# Patient Record
Sex: Female | Born: 1965 | Race: White | Hispanic: No | Marital: Married | Smoking: Former smoker
Health system: Southern US, Community
[De-identification: ages and names within clinical notes are randomized; demographics above are authoritative.]

## PROBLEM LIST (undated history)

## (undated) DIAGNOSIS — R112 Nausea with vomiting, unspecified: Secondary | ICD-10-CM

## (undated) DIAGNOSIS — Z9889 Other specified postprocedural states: Secondary | ICD-10-CM

## (undated) DIAGNOSIS — T8859XA Other complications of anesthesia, initial encounter: Secondary | ICD-10-CM

## (undated) DIAGNOSIS — M169 Osteoarthritis of hip, unspecified: Secondary | ICD-10-CM

## (undated) HISTORY — PX: PARTIAL HYSTERECTOMY: SHX80

---

## 2003-06-29 HISTORY — PX: HAND SURGERY: SHX662

## 2006-06-28 HISTORY — PX: CHOLECYSTECTOMY: SHX55

## 2017-09-03 DIAGNOSIS — J22 Unspecified acute lower respiratory infection: Secondary | ICD-10-CM | POA: Insufficient documentation

## 2017-09-12 ENCOUNTER — Other Ambulatory Visit: Payer: Self-pay | Admitting: Family Medicine

## 2017-09-12 DIAGNOSIS — Z1231 Encounter for screening mammogram for malignant neoplasm of breast: Secondary | ICD-10-CM

## 2017-09-29 ENCOUNTER — Ambulatory Visit
Admission: RE | Admit: 2017-09-29 | Discharge: 2017-09-29 | Disposition: A | Discharge status: Home / Self Care | Payer: BLUE CROSS/BLUE SHIELD | Source: Ambulatory Visit | Attending: Family Medicine | Admitting: Family Medicine

## 2017-09-29 DIAGNOSIS — Z1231 Encounter for screening mammogram for malignant neoplasm of breast: Secondary | ICD-10-CM

## 2018-08-08 ENCOUNTER — Ambulatory Visit (INDEPENDENT_AMBULATORY_CARE_PROVIDER_SITE_OTHER): Payer: BLUE CROSS/BLUE SHIELD | Admitting: Orthopaedic Surgery

## 2018-08-08 ENCOUNTER — Ambulatory Visit (INDEPENDENT_AMBULATORY_CARE_PROVIDER_SITE_OTHER): Payer: BLUE CROSS/BLUE SHIELD

## 2018-08-08 ENCOUNTER — Encounter (INDEPENDENT_AMBULATORY_CARE_PROVIDER_SITE_OTHER): Payer: Self-pay | Admitting: Orthopaedic Surgery

## 2018-08-08 VITALS — Ht 65.0 in | Wt 268.0 lb

## 2018-08-08 DIAGNOSIS — M1611 Unilateral primary osteoarthritis, right hip: Secondary | ICD-10-CM

## 2018-08-08 DIAGNOSIS — M545 Low back pain, unspecified: Secondary | ICD-10-CM

## 2018-08-08 DIAGNOSIS — M25572 Pain in left ankle and joints of left foot: Secondary | ICD-10-CM

## 2018-08-08 DIAGNOSIS — G8929 Other chronic pain: Secondary | ICD-10-CM

## 2018-08-08 NOTE — Progress Notes (Addendum)
Office Visit Note   Patient: Kari Salinas           Date of Birth: 18-Nov-1965           MRN: [id number] Visit Date: 08/08/2018              Requested by: Richmond CampbellKaplan, Kristen W., PA-C [address] [place], [place] [id number] PCP: Richmond CampbellKaplan, Kristen W., PA-C   Assessment & Plan: Visit Diagnoses:  1. Chronic pain of left ankle   2. Primary osteoarthritis of right hip   3. Low back pain, unspecified back pain laterality, unspecified chronicity, unspecified whether sciatica present     Plan: Impression is end-stage right hip degenerative joint disease and early mild left ankle osteoarthritis more symptomatic on the lateral side.  We discussed surgical versus nonsurgical treatment options regarding the right hip today.  Patient would like to try cortisone injection in both the left ankle and the right hip first.  We will schedule this with Dr. Prince Romehilts in the near future.  She has my card if she needs to get in touch with us.  Patient understands that she will likely require hip replacement for any meaningful pain relief.  Follow-Up Instructions: Return if symptoms worsen or fail to improve.   Orders:  Orders Placed This Encounter  Procedures  . XR Ankle Complete Left  . XR Foot Complete Left  . XR HIP UNILAT W OR W/O PELVIS 2-3 VIEWS RIGHT  . XR Lumbar Spine 2-3 Views   No orders of the defined types were placed in this encounter.     Procedures: No procedures performed   Clinical Data: No additional findings.   Subjective: Chief Complaint  Patient presents with  . Right Hip - Pain  . Left Foot - Pain  . Left Ankle - Pain    Kari Salinas is a very pleasant 53 year old female who comes in with 5 years of right hip pain that is progressively gotten worse.  This has given her severe difficulty and remaining active.  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  She is also complaining of some left hip pain.  She denies any back pain or radicular  symptoms.  Denies any injuries to her left ankle or right hip.  She takes meloxicam and tramadol for the pain but this gives her temporary relief and she takes on a regular basis.   Review of Systems  Constitutional: Negative.   HENT: Negative.   Eyes: Negative.   Respiratory: Negative.   Cardiovascular: Negative.   Endocrine: Negative.   Musculoskeletal: Negative.   Neurological: Negative.   Hematological: Negative.   Psychiatric/Behavioral: Negative.   All other systems reviewed and are negative.    Objective: Vital Signs: There were no vitals taken for this visit.  Physical Exam Vitals signs and nursing note reviewed.  Constitutional:      Appearance: She is well-developed.  HENT:     Head: Normocephalic and atraumatic.  Neck:     Musculoskeletal: Neck supple.  Pulmonary:     Effort: Pulmonary effort is normal.  Abdominal:     Palpations: Abdomen is soft.  Skin:    General: Skin is warm.     Capillary Refill: Capillary refill takes less than 2 seconds.  Neurological:     Mental Status: She is alert and oriented to person, place, and time.  Psychiatric:        Behavior: Behavior normal.  Thought Content: Thought content normal.        Judgment: Judgment normal.     Ortho Exam  Specialty Comments:  No specialty comments available.  Imaging: Xr Ankle Complete Left  Result Date: 08/08/2018 Medial and lateral gutter spurring of the ankle joint no significant joint space narrowing.  Xr Foot Complete Left  Result Date: 08/08/2018 No acute or structural normalities.  There is evidence of a healed fifth toe fracture  Xr Hip Unilat W Or W/o Pelvis 2-3 Views Right  Result Date: 08/08/2018 Advanced right hip degenerative joint disease with bone-on-bone joint space narrowing  Xr Lumbar Spine 2-3 Views  Result Date: 08/08/2018 No acute or structural abnormalities    PMFS History: There are no active problems to display for this patient.  History  reviewed. No pertinent past medical history.  History reviewed. No pertinent family history.  History reviewed. No pertinent surgical history. Social History   Occupational History  . Not on file  Tobacco Use  . Smoking status: Not on file  Substance and Sexual Activity  . Alcohol use: Not on file  . Drug use: Not on file  . Sexual activity: Not on file

## 2018-08-14 ENCOUNTER — Encounter (INDEPENDENT_AMBULATORY_CARE_PROVIDER_SITE_OTHER): Payer: Self-pay | Admitting: Family Medicine

## 2018-08-14 ENCOUNTER — Ambulatory Visit (INDEPENDENT_AMBULATORY_CARE_PROVIDER_SITE_OTHER): Payer: BLUE CROSS/BLUE SHIELD | Admitting: Family Medicine

## 2018-08-14 DIAGNOSIS — M1611 Unilateral primary osteoarthritis, right hip: Secondary | ICD-10-CM

## 2018-08-14 DIAGNOSIS — G8929 Other chronic pain: Secondary | ICD-10-CM

## 2018-08-14 DIAGNOSIS — M25572 Pain in left ankle and joints of left foot: Secondary | ICD-10-CM

## 2018-08-14 MED ORDER — METHYLPREDNISOLONE ACETATE 40 MG/ML IJ SUSP: 40.0000 mg | Freq: Once | INTRAMUSCULAR | Status: DC

## 2018-08-14 NOTE — Progress Notes (Signed)
Subjective: She is here for planned ultrasound-guided injections of right hip and left ankle.  She has osteoarthritis in both of these areas.  She is planning on having her right hip replaced this summer due to its effect on her quality of life.  Objective: Right hip has limited active and passive range of motion and it is very painful with passive internal rotation.  Left ankle is tender over the anterolateral ankle joint line.  Procedure: Ultrasound-guided right hip and left ankle injections: After sterile prep with Betadine, injected 8 cc 1% lidocaine without epinephrine and 40 mg methylprednisolone into the right hip using a 22-gauge spinal needle passing the needle through the iliofemoral ligament into the femoral head/neck junction.  Injectate 5 cc 1% lidocaine without epinephrine and 40 mg methylprednisolone into the anterolateral ankle joint recess using ultrasound to guide needle placement.  She had good pain relief during the immediate anesthetic phase.  She will follow-up as directed.

## 2019-02-01 ENCOUNTER — Other Ambulatory Visit: Payer: Self-pay | Admitting: Family Medicine

## 2019-02-01 DIAGNOSIS — Z1231 Encounter for screening mammogram for malignant neoplasm of breast: Secondary | ICD-10-CM

## 2019-02-06 ENCOUNTER — Ambulatory Visit
Admission: RE | Admit: 2019-02-06 | Discharge: 2019-02-06 | Disposition: A | Discharge status: Home / Self Care | Payer: BC Managed Care – PPO | Source: Ambulatory Visit | Attending: Family Medicine | Admitting: Family Medicine

## 2019-02-06 ENCOUNTER — Other Ambulatory Visit: Payer: Self-pay

## 2019-02-06 DIAGNOSIS — Z1231 Encounter for screening mammogram for malignant neoplasm of breast: Secondary | ICD-10-CM

## 2020-02-07 ENCOUNTER — Other Ambulatory Visit: Payer: Self-pay | Admitting: Family Medicine

## 2020-02-07 DIAGNOSIS — Z1231 Encounter for screening mammogram for malignant neoplasm of breast: Secondary | ICD-10-CM

## 2020-02-22 ENCOUNTER — Ambulatory Visit
Admission: RE | Admit: 2020-02-22 | Discharge: 2020-02-22 | Disposition: A | Discharge status: Home / Self Care | Payer: BC Managed Care – PPO | Source: Ambulatory Visit | Attending: Family Medicine | Admitting: Family Medicine

## 2020-02-22 ENCOUNTER — Other Ambulatory Visit: Payer: Self-pay

## 2020-02-22 DIAGNOSIS — Z1231 Encounter for screening mammogram for malignant neoplasm of breast: Secondary | ICD-10-CM

## 2020-02-27 ENCOUNTER — Other Ambulatory Visit: Payer: Self-pay | Admitting: Family Medicine

## 2020-02-27 DIAGNOSIS — R928 Other abnormal and inconclusive findings on diagnostic imaging of breast: Secondary | ICD-10-CM

## 2020-03-06 ENCOUNTER — Other Ambulatory Visit: Payer: Self-pay | Admitting: Family Medicine

## 2020-03-06 ENCOUNTER — Ambulatory Visit
Admission: RE | Admit: 2020-03-06 | Discharge: 2020-03-06 | Disposition: A | Discharge status: Home / Self Care | Payer: BC Managed Care – PPO | Source: Ambulatory Visit | Attending: Family Medicine | Admitting: Family Medicine

## 2020-03-06 ENCOUNTER — Other Ambulatory Visit: Payer: Self-pay

## 2020-03-06 DIAGNOSIS — R928 Other abnormal and inconclusive findings on diagnostic imaging of breast: Secondary | ICD-10-CM

## 2020-03-06 DIAGNOSIS — N6489 Other specified disorders of breast: Secondary | ICD-10-CM

## 2020-03-17 ENCOUNTER — Ambulatory Visit
Admission: RE | Admit: 2020-03-17 | Discharge: 2020-03-17 | Disposition: A | Discharge status: Home / Self Care | Payer: BC Managed Care – PPO | Source: Ambulatory Visit | Attending: Family Medicine | Admitting: Family Medicine

## 2020-03-17 ENCOUNTER — Other Ambulatory Visit: Payer: Self-pay

## 2020-03-17 DIAGNOSIS — R928 Other abnormal and inconclusive findings on diagnostic imaging of breast: Secondary | ICD-10-CM

## 2020-03-17 DIAGNOSIS — N6489 Other specified disorders of breast: Secondary | ICD-10-CM

## 2020-04-24 ENCOUNTER — Other Ambulatory Visit: Payer: Self-pay | Admitting: General Surgery

## 2020-05-14 ENCOUNTER — Other Ambulatory Visit: Payer: Self-pay | Admitting: General Surgery

## 2020-05-14 DIAGNOSIS — N6489 Other specified disorders of breast: Secondary | ICD-10-CM

## 2020-07-10 ENCOUNTER — Other Ambulatory Visit: Payer: Self-pay

## 2020-07-10 ENCOUNTER — Encounter (HOSPITAL_BASED_OUTPATIENT_CLINIC_OR_DEPARTMENT_OTHER): Payer: Self-pay | Admitting: General Surgery

## 2020-07-14 ENCOUNTER — Other Ambulatory Visit (HOSPITAL_COMMUNITY)
Admission: RE | Admit: 2020-07-14 | Discharge: 2020-07-14 | Disposition: A | Discharge status: Home / Self Care | Payer: BC Managed Care – PPO | Source: Ambulatory Visit | Attending: General Surgery | Admitting: General Surgery

## 2020-07-14 ENCOUNTER — Other Ambulatory Visit (HOSPITAL_COMMUNITY): Payer: BC Managed Care – PPO

## 2020-07-14 DIAGNOSIS — Z79891 Long term (current) use of opiate analgesic: Secondary | ICD-10-CM | POA: Diagnosis not present

## 2020-07-14 DIAGNOSIS — Z881 Allergy status to other antibiotic agents status: Secondary | ICD-10-CM | POA: Diagnosis not present

## 2020-07-14 DIAGNOSIS — Z20822 Contact with and (suspected) exposure to covid-19: Secondary | ICD-10-CM | POA: Insufficient documentation

## 2020-07-14 DIAGNOSIS — Z885 Allergy status to narcotic agent status: Secondary | ICD-10-CM | POA: Diagnosis not present

## 2020-07-14 DIAGNOSIS — Z87891 Personal history of nicotine dependence: Secondary | ICD-10-CM | POA: Diagnosis not present

## 2020-07-14 DIAGNOSIS — N6489 Other specified disorders of breast: Secondary | ICD-10-CM | POA: Diagnosis present

## 2020-07-14 DIAGNOSIS — Z01812 Encounter for preprocedural laboratory examination: Secondary | ICD-10-CM | POA: Insufficient documentation

## 2020-07-14 DIAGNOSIS — Z791 Long term (current) use of non-steroidal anti-inflammatories (NSAID): Secondary | ICD-10-CM | POA: Diagnosis not present

## 2020-07-14 DIAGNOSIS — N6022 Fibroadenosis of left breast: Secondary | ICD-10-CM | POA: Diagnosis not present

## 2020-07-14 LAB — SARS CORONAVIRUS 2 (TAT 6-24 HRS): SARS Coronavirus 2: NEGATIVE

## 2020-07-15 ENCOUNTER — Inpatient Hospital Stay (HOSPITAL_COMMUNITY): Admission: RE | Admit: 2020-07-15 | Payer: BC Managed Care – PPO | Source: Ambulatory Visit

## 2020-07-16 ENCOUNTER — Other Ambulatory Visit: Payer: Self-pay

## 2020-07-16 ENCOUNTER — Ambulatory Visit
Admission: RE | Admit: 2020-07-16 | Discharge: 2020-07-16 | Disposition: A | Discharge status: Home / Self Care | Payer: BC Managed Care – PPO | Source: Ambulatory Visit | Attending: General Surgery | Admitting: General Surgery

## 2020-07-16 DIAGNOSIS — N6489 Other specified disorders of breast: Secondary | ICD-10-CM

## 2020-07-16 NOTE — Anesthesia Preprocedure Evaluation (Signed)
Anesthesia Evaluation  Patient identified by MRN, date of birth, ID band Patient awake    Reviewed: Allergy & Precautions, NPO status , Patient's Chart, lab work & pertinent test results  History of Anesthesia Complications (+) PONV  Airway Mallampati: II  TM Distance: >3 FB Neck ROM: Full    Dental  (+) Dental Advisory Given   Pulmonary former smoker,    breath sounds clear to auscultation       Cardiovascular negative cardio ROS   Rhythm:Regular Rate:Normal     Neuro/Psych negative neurological ROS     GI/Hepatic negative GI ROS, Neg liver ROS,   Endo/Other  Morbid obesity  Renal/GU negative Renal ROS     Musculoskeletal  (+) Arthritis ,   Abdominal   Peds  Hematology negative hematology ROS (+)   Anesthesia Other Findings   Reproductive/Obstetrics                            Anesthesia Physical Anesthesia Plan  ASA: III  Anesthesia Plan: General   Post-op Pain Management:    Induction: Intravenous  PONV Risk Score and Plan: 4 or greater and Scopolamine patch - Pre-op, Midazolam, Dexamethasone, Ondansetron, Treatment may vary due to age or medical condition and Propofol infusion  Airway Management Planned: LMA  Additional Equipment:   Intra-op Plan:   Post-operative Plan: Extubation in OR  Informed Consent: I have reviewed the patients History and Physical, chart, labs and discussed the procedure including the risks, benefits and alternatives for the proposed anesthesia with the patient or authorized representative who has indicated his/her understanding and acceptance.     Dental advisory given  Plan Discussed with: CRNA  Anesthesia Plan Comments:       Anesthesia Quick Evaluation

## 2020-07-16 NOTE — Anesthesia Preprocedure Evaluation (Deleted)
Anesthesia Evaluation    Reviewed: Allergy & Precautions, Patient's Chart, lab work & pertinent test results  History of Anesthesia Complications (+) PONV and history of anesthetic complications  Airway        Dental   Pulmonary neg pulmonary ROS, former smoker,           Cardiovascular negative cardio ROS       Neuro/Psych negative neurological ROS     GI/Hepatic negative GI ROS, Neg liver ROS,   Endo/Other  Morbid obesity  Renal/GU negative Renal ROS     Musculoskeletal negative musculoskeletal ROS (+)   Abdominal   Peds  Hematology negative hematology ROS (+)   Anesthesia Other Findings   Reproductive/Obstetrics                             Anesthesia Physical Anesthesia Plan  ASA: III  Anesthesia Plan: General   Post-op Pain Management:    Induction: Intravenous  PONV Risk Score and Plan: 4 or greater and Ondansetron, Dexamethasone, TIVA, Midazolam and Scopolamine patch - Pre-op  Airway Management Planned: LMA  Additional Equipment:   Intra-op Plan:   Post-operative Plan: Extubation in OR  Informed Consent:   Plan Discussed with: Anesthesiologist  Anesthesia Plan Comments:         Anesthesia Quick Evaluation

## 2020-07-16 NOTE — Progress Notes (Signed)
Complete ensure presurgery drink by 0400 CHG soap given for tonight/tomorrow morning showers Pt verbalized understanding       Enhanced Recovery after Surgery for Orthopedics Enhanced Recovery after Surgery is a protocol used to improve the stress on your body and your recovery after surgery.  Patient Instructions  . The night before surgery:  o No food after midnight. ONLY clear liquids after midnight  . The day of surgery (if you do NOT have diabetes):  o Drink ONE (1) Pre-Surgery Clear Ensure as directed.   o This drink was given to you during your hospital  pre-op appointment visit. o The pre-op nurse will instruct you on the time to drink the  Pre-Surgery Ensure depending on your surgery time. o Finish the drink at the designated time by the pre-op nurse.  o Nothing else to drink after completing the  Pre-Surgery Clear Ensure.  . The day of surgery (if you have diabetes): o Drink ONE (1) Gatorade 2 (G2) as directed. o This drink was given to you during your hospital  pre-op appointment visit.  o The pre-op nurse will instruct you on the time to drink the   Gatorade 2 (G2) depending on your surgery time. o Color of the Gatorade may vary. Red is not allowed. o Nothing else to drink after completing the  Gatorade 2 (G2).         If you have questions, please contact your surgeon's office.

## 2020-07-17 ENCOUNTER — Encounter (HOSPITAL_BASED_OUTPATIENT_CLINIC_OR_DEPARTMENT_OTHER)
Admission: RE | Disposition: A | Discharge status: Home / Self Care | Payer: Self-pay | Source: Home / Self Care | Attending: General Surgery

## 2020-07-17 ENCOUNTER — Ambulatory Visit
Admission: RE | Admit: 2020-07-17 | Discharge: 2020-07-17 | Disposition: A | Discharge status: Home / Self Care | Payer: BC Managed Care – PPO | Source: Ambulatory Visit | Attending: General Surgery | Admitting: General Surgery

## 2020-07-17 ENCOUNTER — Other Ambulatory Visit: Payer: Self-pay

## 2020-07-17 ENCOUNTER — Ambulatory Visit (HOSPITAL_BASED_OUTPATIENT_CLINIC_OR_DEPARTMENT_OTHER): Payer: BC Managed Care – PPO | Admitting: Anesthesiology

## 2020-07-17 ENCOUNTER — Encounter (HOSPITAL_BASED_OUTPATIENT_CLINIC_OR_DEPARTMENT_OTHER): Payer: Self-pay | Admitting: General Surgery

## 2020-07-17 ENCOUNTER — Ambulatory Visit (HOSPITAL_BASED_OUTPATIENT_CLINIC_OR_DEPARTMENT_OTHER)
Admission: RE | Admit: 2020-07-17 | Discharge: 2020-07-17 | Disposition: A | Discharge status: Home / Self Care | Payer: BC Managed Care – PPO | Attending: General Surgery | Admitting: General Surgery

## 2020-07-17 DIAGNOSIS — Z87891 Personal history of nicotine dependence: Secondary | ICD-10-CM | POA: Insufficient documentation

## 2020-07-17 DIAGNOSIS — N6489 Other specified disorders of breast: Secondary | ICD-10-CM

## 2020-07-17 DIAGNOSIS — Z791 Long term (current) use of non-steroidal anti-inflammatories (NSAID): Secondary | ICD-10-CM | POA: Insufficient documentation

## 2020-07-17 DIAGNOSIS — Z881 Allergy status to other antibiotic agents status: Secondary | ICD-10-CM | POA: Insufficient documentation

## 2020-07-17 DIAGNOSIS — Z20822 Contact with and (suspected) exposure to covid-19: Secondary | ICD-10-CM | POA: Insufficient documentation

## 2020-07-17 DIAGNOSIS — Z79891 Long term (current) use of opiate analgesic: Secondary | ICD-10-CM | POA: Insufficient documentation

## 2020-07-17 DIAGNOSIS — Z885 Allergy status to narcotic agent status: Secondary | ICD-10-CM | POA: Insufficient documentation

## 2020-07-17 DIAGNOSIS — N6022 Fibroadenosis of left breast: Secondary | ICD-10-CM | POA: Insufficient documentation

## 2020-07-17 HISTORY — DX: Other specified postprocedural states: R11.2

## 2020-07-17 HISTORY — PX: RADIOACTIVE SEED GUIDED EXCISIONAL BREAST BIOPSY: SHX6490

## 2020-07-17 HISTORY — PX: BREAST EXCISIONAL BIOPSY: SUR124

## 2020-07-17 HISTORY — DX: Other specified postprocedural states: Z98.890

## 2020-07-17 HISTORY — DX: Other complications of anesthesia, initial encounter: T88.59XA

## 2020-07-17 HISTORY — DX: Osteoarthritis of hip, unspecified: M16.9

## 2020-07-17 SURGERY — RADIOACTIVE SEED GUIDED BREAST BIOPSY
Anesthesia: General | Site: Breast | Laterality: Left

## 2020-07-17 MED ORDER — ACETAMINOPHEN 500 MG PO TABS
1000.0000 mg | ORAL_TABLET | ORAL | Status: AC
  Administered 2020-07-17: 1000 mg via ORAL

## 2020-07-17 MED ORDER — ENSURE PRE-SURGERY PO LIQD: 296.0000 mL | Freq: Once | ORAL | Status: DC

## 2020-07-17 MED ORDER — PHENYLEPHRINE HCL (PRESSORS) 10 MG/ML IV SOLN
INTRAVENOUS | Status: DC | PRN
  Administered 2020-07-17: 80 ug via INTRAVENOUS
  Administered 2020-07-17: 120 ug via INTRAVENOUS

## 2020-07-17 MED ORDER — OXYCODONE HCL 5 MG PO TABS
5.0000 mg | ORAL_TABLET | Freq: Once | ORAL | Status: AC | PRN
  Administered 2020-07-17: 5 mg via ORAL

## 2020-07-17 MED ORDER — PROPOFOL 10 MG/ML IV BOLUS
INTRAVENOUS | Status: AC
  Filled 2020-07-17: qty 40

## 2020-07-17 MED ORDER — ONDANSETRON HCL 4 MG/2ML IJ SOLN
INTRAMUSCULAR | Status: AC
  Filled 2020-07-17: qty 2

## 2020-07-17 MED ORDER — KETOROLAC TROMETHAMINE 15 MG/ML IJ SOLN
INTRAMUSCULAR | Status: AC
  Filled 2020-07-17: qty 1

## 2020-07-17 MED ORDER — 0.9 % SODIUM CHLORIDE (POUR BTL) OPTIME
TOPICAL | Status: DC | PRN
  Administered 2020-07-17: 50 mL

## 2020-07-17 MED ORDER — LIDOCAINE 2% (20 MG/ML) 5 ML SYRINGE
INTRAMUSCULAR | Status: AC
  Filled 2020-07-17: qty 5

## 2020-07-17 MED ORDER — FENTANYL CITRATE (PF) 100 MCG/2ML IJ SOLN: 25.0000 ug | INTRAMUSCULAR | Status: DC | PRN

## 2020-07-17 MED ORDER — BUPIVACAINE HCL (PF) 0.25 % IJ SOLN
INTRAMUSCULAR | Status: DC | PRN
  Administered 2020-07-17: 10 mL

## 2020-07-17 MED ORDER — METHYLENE BLUE 0.5 % INJ SOLN
INTRAVENOUS | Status: AC
  Filled 2020-07-17: qty 60

## 2020-07-17 MED ORDER — LIDOCAINE HCL (CARDIAC) PF 100 MG/5ML IV SOSY
PREFILLED_SYRINGE | INTRAVENOUS | Status: DC | PRN
  Administered 2020-07-17: 100 mg via INTRAVENOUS

## 2020-07-17 MED ORDER — FENTANYL CITRATE (PF) 100 MCG/2ML IJ SOLN
INTRAMUSCULAR | Status: AC
  Filled 2020-07-17: qty 2

## 2020-07-17 MED ORDER — OXYCODONE HCL 5 MG PO TABS
ORAL_TABLET | ORAL | Status: AC
  Filled 2020-07-17: qty 1

## 2020-07-17 MED ORDER — CEFAZOLIN SODIUM-DEXTROSE 2-4 GM/100ML-% IV SOLN
INTRAVENOUS | Status: AC
  Filled 2020-07-17: qty 100

## 2020-07-17 MED ORDER — PROPOFOL 500 MG/50ML IV EMUL
INTRAVENOUS | Status: DC | PRN
  Administered 2020-07-17: 25 ug/kg/min via INTRAVENOUS

## 2020-07-17 MED ORDER — SCOPOLAMINE 1 MG/3DAYS TD PT72
MEDICATED_PATCH | TRANSDERMAL | Status: AC
  Filled 2020-07-17: qty 1

## 2020-07-17 MED ORDER — DEXAMETHASONE SODIUM PHOSPHATE 10 MG/ML IJ SOLN
INTRAMUSCULAR | Status: AC
  Filled 2020-07-17: qty 1

## 2020-07-17 MED ORDER — ONDANSETRON HCL 4 MG/2ML IJ SOLN
INTRAMUSCULAR | Status: DC | PRN
  Administered 2020-07-17: 4 mg via INTRAVENOUS

## 2020-07-17 MED ORDER — LACTATED RINGERS IV SOLN: INTRAVENOUS | Status: DC | PRN

## 2020-07-17 MED ORDER — OXYCODONE HCL 5 MG/5ML PO SOLN: 5.0000 mg | Freq: Once | ORAL | Status: AC | PRN

## 2020-07-17 MED ORDER — BUPIVACAINE HCL (PF) 0.25 % IJ SOLN
INTRAMUSCULAR | Status: AC
  Filled 2020-07-17: qty 180

## 2020-07-17 MED ORDER — LACTATED RINGERS IV SOLN: INTRAVENOUS | Status: DC

## 2020-07-17 MED ORDER — AMISULPRIDE (ANTIEMETIC) 5 MG/2ML IV SOLN: 10.0000 mg | Freq: Once | INTRAVENOUS | Status: DC | PRN

## 2020-07-17 MED ORDER — DEXAMETHASONE SODIUM PHOSPHATE 10 MG/ML IJ SOLN
INTRAMUSCULAR | Status: DC | PRN
  Administered 2020-07-17: 5 mg via INTRAVENOUS

## 2020-07-17 MED ORDER — CEFAZOLIN SODIUM-DEXTROSE 1-4 GM/50ML-% IV SOLN
INTRAVENOUS | Status: AC
  Filled 2020-07-17: qty 50

## 2020-07-17 MED ORDER — ACETAMINOPHEN 500 MG PO TABS
ORAL_TABLET | ORAL | Status: AC
  Filled 2020-07-17: qty 2

## 2020-07-17 MED ORDER — PROPOFOL 10 MG/ML IV BOLUS
INTRAVENOUS | Status: DC | PRN
  Administered 2020-07-17: 200 mg via INTRAVENOUS

## 2020-07-17 MED ORDER — SCOPOLAMINE 1 MG/3DAYS TD PT72
1.0000 | MEDICATED_PATCH | TRANSDERMAL | Status: DC
  Administered 2020-07-17: 1.5 mg via TRANSDERMAL

## 2020-07-17 MED ORDER — CEFAZOLIN SODIUM-DEXTROSE 2-4 GM/100ML-% IV SOLN: 2.0000 g | INTRAVENOUS | Status: DC

## 2020-07-17 MED ORDER — FENTANYL CITRATE (PF) 100 MCG/2ML IJ SOLN
INTRAMUSCULAR | Status: DC | PRN
  Administered 2020-07-17 (×3): 50 ug via INTRAVENOUS

## 2020-07-17 MED ORDER — KETOROLAC TROMETHAMINE 15 MG/ML IJ SOLN
15.0000 mg | INTRAMUSCULAR | Status: AC
  Administered 2020-07-17: 15 mg via INTRAVENOUS

## 2020-07-17 MED ORDER — CEFAZOLIN SODIUM-DEXTROSE 2-3 GM-%(50ML) IV SOLR
INTRAVENOUS | Status: DC | PRN
  Administered 2020-07-17: 3 g via INTRAVENOUS

## 2020-07-17 SURGICAL SUPPLY — 54 items
APPLIER CLIP 9.375 MED OPEN (MISCELLANEOUS)
BINDER BREAST 3XL (GAUZE/BANDAGES/DRESSINGS) ×2 IMPLANT
BINDER BREAST LRG (GAUZE/BANDAGES/DRESSINGS) IMPLANT
BINDER BREAST MEDIUM (GAUZE/BANDAGES/DRESSINGS) IMPLANT
BINDER BREAST XLRG (GAUZE/BANDAGES/DRESSINGS) IMPLANT
BINDER BREAST XXLRG (GAUZE/BANDAGES/DRESSINGS) IMPLANT
BLADE SURG 15 STRL LF DISP TIS (BLADE) ×1 IMPLANT
BLADE SURG 15 STRL SS (BLADE) ×1
CANISTER SUC SOCK COL 7IN (MISCELLANEOUS) IMPLANT
CANISTER SUCT 1200ML W/VALVE (MISCELLANEOUS) IMPLANT
CHLORAPREP W/TINT 26 (MISCELLANEOUS) ×2 IMPLANT
CLIP APPLIE 9.375 MED OPEN (MISCELLANEOUS) IMPLANT
CLIP VESOCCLUDE SM WIDE 6/CT (CLIP) IMPLANT
COVER BACK TABLE 60X90IN (DRAPES) ×2 IMPLANT
COVER MAYO STAND STRL (DRAPES) ×2 IMPLANT
COVER PROBE W GEL 5X96 (DRAPES) IMPLANT
COVER WAND RF STERILE (DRAPES) IMPLANT
DECANTER SPIKE VIAL GLASS SM (MISCELLANEOUS) IMPLANT
DERMABOND ADVANCED (GAUZE/BANDAGES/DRESSINGS) ×1
DERMABOND ADVANCED .7 DNX12 (GAUZE/BANDAGES/DRESSINGS) ×1 IMPLANT
DRAPE GAMMA PROBE CRDLSS 10X38 (DRAPES) ×2 IMPLANT
DRAPE LAPAROSCOPIC ABDOMINAL (DRAPES) ×2 IMPLANT
DRAPE UTILITY XL STRL (DRAPES) ×2 IMPLANT
DRSG TEGADERM 4X4.75 (GAUZE/BANDAGES/DRESSINGS) IMPLANT
ELECT COATED BLADE 2.86 ST (ELECTRODE) ×2 IMPLANT
ELECT REM PT RETURN 9FT ADLT (ELECTROSURGICAL) ×2
ELECTRODE REM PT RTRN 9FT ADLT (ELECTROSURGICAL) ×1 IMPLANT
GAUZE SPONGE 4X4 12PLY STRL LF (GAUZE/BANDAGES/DRESSINGS) IMPLANT
GLOVE SURG ENC MOIS LTX SZ7 (GLOVE) ×4 IMPLANT
GLOVE SURG UNDER POLY LF SZ7.5 (GLOVE) ×2 IMPLANT
GOWN STRL REUS W/ TWL LRG LVL3 (GOWN DISPOSABLE) ×2 IMPLANT
GOWN STRL REUS W/TWL LRG LVL3 (GOWN DISPOSABLE) ×2
HEMOSTAT ARISTA ABSORB 3G PWDR (HEMOSTASIS) IMPLANT
KIT MARKER MARGIN INK (KITS) ×2 IMPLANT
NEEDLE HYPO 25X1 1.5 SAFETY (NEEDLE) ×2 IMPLANT
NS IRRIG 1000ML POUR BTL (IV SOLUTION) IMPLANT
PACK BASIN DAY SURGERY FS (CUSTOM PROCEDURE TRAY) ×2 IMPLANT
PENCIL SMOKE EVACUATOR (MISCELLANEOUS) ×2 IMPLANT
RETRACTOR ONETRAX LX 90X20 (MISCELLANEOUS) IMPLANT
SLEEVE SCD COMPRESS KNEE MED (MISCELLANEOUS) ×2 IMPLANT
SPONGE LAP 4X18 RFD (DISPOSABLE) ×2 IMPLANT
STRIP CLOSURE SKIN 1/2X4 (GAUZE/BANDAGES/DRESSINGS) ×2 IMPLANT
SUT MNCRL AB 4-0 PS2 18 (SUTURE) ×4 IMPLANT
SUT MON AB 5-0 PS2 18 (SUTURE) ×2 IMPLANT
SUT SILK 2 0 SH (SUTURE) IMPLANT
SUT VIC AB 2-0 SH 27 (SUTURE) ×1
SUT VIC AB 2-0 SH 27XBRD (SUTURE) ×1 IMPLANT
SUT VIC AB 3-0 SH 27 (SUTURE) ×1
SUT VIC AB 3-0 SH 27X BRD (SUTURE) ×1 IMPLANT
SYR CONTROL 10ML LL (SYRINGE) ×2 IMPLANT
TOWEL GREEN STERILE FF (TOWEL DISPOSABLE) ×2 IMPLANT
TRAY FAXITRON CT DISP (TRAY / TRAY PROCEDURE) ×2 IMPLANT
TUBE CONNECTING 20X1/4 (TUBING) IMPLANT
YANKAUER SUCT BULB TIP NO VENT (SUCTIONS) IMPLANT

## 2020-07-17 NOTE — H&P (Signed)
  55 yof with no prior breast history and no family history breast/ovarian cancer presents after screening mm. she has b density breasts. she has a possible left breast asymmetry. right negative. no sonographic correlate. biopsy was done that shows a csl with UDH, and FCC. she is here with her husband to discuss options today  Past Surgical History Breast Biopsy  Left. Gallbladder Surgery - Laparoscopic  Gallbladder Surgery - Open  Hysterectomy (not due to cancer) - Partial   Diagnostic Studies History  Colonoscopy  >10 years ago Mammogram  within last year Pap Smear  >5 years ago  Allergies  Levaquin *FLUOROQUINOLONES*  [place] *ANALGESICS - OPIOID*  Allergies Reconciled   Medication History  traMADol HCl (50MG  Tablet, Oral) Active. Diclofenac Sodium (75MG  Tablet DR, Oral) Active. Medications Reconciled  Social History  Alcohol use  Occasional alcohol use. Caffeine use  Carbonated beverages, Coffee. No drug use  Tobacco use  Former smoker.  Family History  Alcohol Abuse  Father. Arthritis  Father, Mother. Heart Disease  Father. Prostate Cancer  Father.  Pregnancy / Birth History Age at menarche  13 years. Age of menopause  72-50 Gravida  1 Length (months) of breastfeeding  7-12 Maternal age  47-40 Para  1  Other Problems  Anxiety Disorder  Arthritis  Back Pain  Cholelithiasis  General anesthesia - complications    Review of Systems  General Present- Weight Gain. Not Present- Appetite Loss, Chills, Fatigue, Fever, Night Sweats and Weight Loss. Skin Not Present- Change in Wart/Mole, Dryness, Hives, Jaundice, New Lesions, Non-Healing Wounds, Rash and Ulcer. HEENT Present- Ringing in the Ears and Wears glasses/contact lenses. Not Present- Earache, Hearing Loss, Hoarseness, Nose Bleed, Oral Ulcers, Seasonal Allergies, Sinus Pain, Sore Throat, Visual Disturbances and Yellow Eyes. Respiratory Present- Snoring. Not Present- Bloody  sputum, Chronic Cough, Difficulty Breathing and Wheezing. Breast Not Present- Breast Mass, Breast Pain, Nipple Discharge and Skin Changes. Cardiovascular Not Present- Chest Pain, Difficulty Breathing Lying Down, Leg Cramps, Palpitations, Rapid Heart Rate, Shortness of Breath and Swelling of Extremities. Gastrointestinal Not Present- Abdominal Pain, Bloating, Bloody Stool, Change in Bowel Habits, Chronic diarrhea, Constipation, Difficulty Swallowing, Excessive gas, Gets full quickly at meals, Hemorrhoids, Indigestion, Nausea, Rectal Pain and Vomiting. Female Genitourinary Not Present- Frequency, Nocturia, Painful Urination, Pelvic Pain and Urgency. Musculoskeletal Present- Joint Pain. Not Present- Back Pain, Joint Stiffness, Muscle Pain, Muscle Weakness and Swelling of Extremities. Neurological Not Present- Decreased Memory, Fainting, Headaches, Numbness, Seizures, Tingling, Tremor, Trouble walking and Weakness. Psychiatric Not Present- Anxiety, Bipolar, Change in Sleep Pattern, Depression, Fearful and Frequent crying. Endocrine Not Present- Cold Intolerance, Excessive Hunger, Hair Changes, Heat Intolerance, Hot flashes and New Diabetes. Hematology Not Present- Blood Thinners, Easy Bruising, Excessive bleeding, Gland problems, HIV and Persistent Infections.  Physical Exam  General Mental Status-Alert. Orientation-Oriented X3. Breast Nipples-No Discharge. Breast Lump-No Palpable Breast Mass. Lymphatic Head & Neck General Head & Neck Lymphatics: Bilateral - Description - Normal. Axillary General Axillary Region: Bilateral - Description - Normal. 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 discussed observation (difficult with asymmetry) and surgery. local upgrade rate with csl on core biopsy with distortion is 15-18% for atypia or  dcis when we looked at it. I recommended excisional biopsy for this and we discussed surgery, risks, and recovery. Will proceed

## 2020-07-17 NOTE — Discharge Instructions (Signed)
Post Anesthesia Home Care Instructions  Activity: Get plenty of rest for the remainder of the day. A responsible individual must stay with you for 24 hours following the procedure.  For the next 24 hours, DO NOT: -Drive a car -Advertising copywriter -Drink alcoholic beverages -Take any medication unless instructed by your physician -Make any legal decisions or sign important papers.  Meals: Start with liquid foods such as gelatin or soup. Progress to regular foods as tolerated. Avoid greasy, spicy, heavy foods. If nausea and/or vomiting occur, drink only clear liquids until the nausea and/or vomiting subsides. Call your physician if vomiting continues.  Special Instructions/Symptoms: Your throat may feel dry or sore from the anesthesia or the breathing tube placed in your throat during surgery. If this causes discomfort, gargle with warm salt water. The discomfort should disappear within 24 hours.  If you had a scopolamine patch placed behind your ear for the management of post- operative nausea and/or vomiting:  1. The medication in the patch is effective for 72 hours, after which it should be removed.  Wrap patch in a tissue and discard in the trash. Wash hands thoroughly with soap and water. 2. You may remove the patch earlier than 72 hours if you experience unpleasant side effects which may include dry mouth, dizziness or visual disturbances. 3. Avoid touching the patch. Wash your hands with soap and water after contact with the patch.        [facility] Office Phone Number [phone]  BREAST BIOPSY/ PARTIAL MASTECTOMY: POST OP INSTRUCTIONS Take 400 mg of ibuprofen every 8 hours or 650 mg tylenol every 6 hours for next 72 hours then as needed. Use ice several times daily also. Always review your discharge instruction sheet given to you by the facility where your surgery was performed.  IF YOU HAVE DISABILITY OR FAMILY LEAVE FORMS, YOU MUST BRING THEM TO THE OFFICE  FOR PROCESSING.  DO NOT GIVE THEM TO YOUR DOCTOR.  1. A prescription for pain medication may be given to you upon discharge.  Take your pain medication as prescribed, if needed.  If narcotic pain medicine is not needed, then you may take acetaminophen (Tylenol), naprosyn (Alleve) or ibuprofen (Advil) as needed. 2. Take your usually prescribed medications unless otherwise directed 3. If you need a refill on your pain medication, please contact your pharmacy.  They will contact our office to request authorization.  Prescriptions will not be filled after 5pm or on week-ends. 4. You should eat very light the first 24 hours after surgery, such as soup, crackers, pudding, etc.  Resume your normal diet the day after surgery. 5. Most patients will experience some swelling and bruising in the breast.  Ice packs and a good support bra will help.  Wear the breast binder provided or a sports bra for 72 hours day and night.  After that wear a sports bra during the day until you return to the office. Swelling and bruising can take several days to resolve.  6. It is common to experience some constipation if taking pain medication after surgery.  Increasing fluid intake and taking a stool softener will usually help or prevent this problem from occurring.  A mild laxative (Milk of Magnesia or Miralax) should be taken according to package directions if there are no bowel movements after 48 hours. 7. Unless discharge instructions indicate otherwise, you may remove your bandages 48 hours after surgery and you may shower at that time.  You may have steri-strips (small skin  tapes) in place directly over the incision.  These strips should be left on the skin for 7-10 days and will come off on their own.  If your surgeon used skin glue on the incision, you may shower in 24 hours.  The glue will flake off over the next 2-3 weeks.  Any sutures or staples will be removed at the office during your follow-up visit. 8. ACTIVITIES:  You  may resume regular daily activities (gradually increasing) beginning the next day.  Wearing a good support bra or sports bra minimizes pain and swelling.  You may have sexual intercourse when it is comfortable. a. You may drive when you no longer are taking prescription pain medication, you can comfortably wear a seatbelt, and you can safely maneuver your car and apply brakes. b. RETURN TO WORK:  ______________________________________________________________________________________ 9. You should see your doctor in the office for a follow-up appointment approximately two weeks after your surgery.  Your doctors nurse will typically make your follow-up appointment when she calls you with your pathology report.  Expect your pathology report 3-4 business days after your surgery.  You may call to check if you do not hear from [place] after three days. 10. OTHER INSTRUCTIONS: _______________________________________________________________________________________________ _____________________________________________________________________________________________________________________________________ _____________________________________________________________________________________________________________________________________ _____________________________________________________________________________________________________________________________________  WHEN TO CALL DR WAKEFIELD: 1. Fever over 101.0 2. Nausea and/or vomiting. 3. Extreme swelling or bruising. 4. Continued bleeding from incision. 5. Increased pain, redness, or drainage from the incision.  The clinic staff is available to answer your questions during regular business hours.  Please dont hesitate to call and ask to speak to one of the nurses for clinical concerns.  If you have a medical emergency, go to the nearest emergency room or call 911.  A surgeon from [facility] is always on call at the hospital.  For further  questions, please visit [email or web address]    You may take Tylenol again at 12:30pm. You may take Ibuprofen again at 1:00pm.

## 2020-07-17 NOTE — Op Note (Signed)
Preoperative diagnosis: Left breast mass on mammogram with core biopsy of csl Postoperative diagnosis: Same as above Procedure: Left breast radioactive seed guided excisional biopsy Surgeon: Dr. Harden Mo Anesthesia: General Estimated blood loss: Minimal Specimens: Left breast tissue containing seed and clip marked with paint Complications: None Drains: None Special count was correct completion Decision to recovery in stable condition  Indications: 79 yof with no prior breast history and no family history breast/ovarian cancer presents after screening mm. she has b density breasts. she has a possible left breast asymmetry. right negative. no sonographic correlate. biopsy was done that shows a csl with UDH, and FCC. she is here with her husband to discuss options today  Procedure: After informed consent was obtained the patient first had a radioactive seed placed. I had these mammograms in the operating room. She was given antibiotics. SCDs were in place. She was then placed under general anesthesia without complication. She was prepped and draped in the standard sterile surgical fashion. Surgical timeout was then performed.  I infiltrated marcaine around the areola and the seed in the uiq. I then made a periareolar incision to hide the scar later.  I removed the seed and some of the surrounding tissue. I then marked this with paint. I then did a mammogram confirming removal of the seed and the clip. Hemostasis was then obtained. I closed the breast tissue with 2-0 Vicryl. The skin was closed with 3-0 Vicryl for Monocryl. Glue and Steri-Strips were applied. She tolerated this well was extubated transferred to recovery in stable condition.

## 2020-07-17 NOTE — Anesthesia Postprocedure Evaluation (Signed)
Anesthesia Post Note  Patient: Kari Salinas  Procedure(s) Performed: LEFT RADIOACTIVE SEED GUIDED EXCISIONAL BREAST BIOPSY (Left Breast)     Patient location during evaluation: PACU Anesthesia Type: General Level of consciousness: awake and alert Pain management: pain level controlled Vital Signs Assessment: post-procedure vital signs reviewed and stable Respiratory status: spontaneous breathing, nonlabored ventilation, respiratory function stable and patient connected to nasal cannula oxygen Cardiovascular status: blood pressure returned to baseline and stable Postop Assessment: no apparent nausea or vomiting Anesthetic complications: no   No complications documented.  Last Vitals:  Vitals:   07/17/20 0846 07/17/20 0853  BP:  (!) 158/74  Pulse: 77 86  Resp: 17 16  Temp:  36.6 C  SpO2: 95% 98%    Last Pain:  Vitals:   07/17/20 0857  TempSrc:   PainSc: 4                  Kennieth Rad

## 2020-07-17 NOTE — Anesthesia Procedure Notes (Signed)
Procedure Name: LMA Insertion Performed by: Nikeshia Keetch M, CRNA Pre-anesthesia Checklist: Patient identified, Emergency Drugs available, Suction available and Patient being monitored Patient Re-evaluated:Patient Re-evaluated prior to induction Oxygen Delivery Method: Circle system utilized Preoxygenation: Pre-oxygenation with 100% oxygen Induction Type: IV induction Ventilation: Mask ventilation without difficulty LMA: LMA inserted LMA Size: 4.0 Number of attempts: 1 Airway Equipment and Method: Bite block Placement Confirmation: positive ETCO2,  CO2 detector and breath sounds checked- equal and bilateral Tube secured with: Tape Dental Injury: Teeth and Oropharynx as per pre-operative assessment        

## 2020-07-17 NOTE — Interval H&P Note (Signed)
History and Physical Interval Note:  07/17/2020 7:34 AM  Kari Salinas  has presented today for surgery, with the diagnosis of LEFT BREAST MASS.  The various methods of treatment have been discussed with the patient and family. After consideration of risks, benefits and other options for treatment, the patient has consented to  Procedure(s): LEFT RADIOACTIVE SEED GUIDED EXCISIONAL BREAST BIOPSY (Left) as a surgical intervention.  The patient's history has been reviewed, patient examined, no change in status, stable for surgery.  I have reviewed the patient's chart and labs.  Questions were answered to the patient's satisfaction.     [name]

## 2020-07-17 NOTE — Transfer of Care (Signed)
Immediate Anesthesia Transfer of Care Note  Patient: Kari Salinas  Procedure(s) Performed: LEFT RADIOACTIVE SEED GUIDED EXCISIONAL BREAST BIOPSY (Left Breast)  Patient Location: PACU  Anesthesia Type:General  Level of Consciousness: awake, alert  and oriented  Airway & Oxygen Therapy: Patient Spontanous Breathing and Patient connected to face mask oxygen  Post-op Assessment: Report given to RN and Post -op Vital signs reviewed and stable  Post vital signs: Reviewed and stable  Last Vitals:  Vitals Value Taken Time  BP    Temp    Pulse 84 07/17/20 0822  Resp 0 07/17/20 0822  SpO2 95 % 07/17/20 0822  Vitals shown include unvalidated device data.  Last Pain:  Vitals:   07/17/20 0629  TempSrc: Oral  PainSc: 0-No pain         Complications: No complications documented.

## 2020-07-18 ENCOUNTER — Encounter (HOSPITAL_BASED_OUTPATIENT_CLINIC_OR_DEPARTMENT_OTHER): Payer: Self-pay | Admitting: General Surgery

## 2020-07-22 LAB — SURGICAL PATHOLOGY

## 2020-10-24 ENCOUNTER — Telehealth: Payer: Self-pay | Admitting: Orthopaedic Surgery

## 2020-10-24 NOTE — Telephone Encounter (Signed)
Please mail CD of ankle & hip xrys to patient. Address on file. Thanks.any questions callback [phone]

## 2020-10-24 NOTE — Telephone Encounter (Signed)
Placed CD in basket to be mailed today 04/29

## 2021-02-25 ENCOUNTER — Other Ambulatory Visit: Payer: Self-pay | Admitting: Family Medicine

## 2021-02-25 DIAGNOSIS — Z1231 Encounter for screening mammogram for malignant neoplasm of breast: Secondary | ICD-10-CM

## 2021-03-15 LAB — COLOGUARD: COLOGUARD: NEGATIVE

## 2021-04-06 ENCOUNTER — Other Ambulatory Visit: Payer: Self-pay

## 2021-04-06 ENCOUNTER — Ambulatory Visit
Admission: RE | Admit: 2021-04-06 | Discharge: 2021-04-06 | Disposition: A | Discharge status: Home / Self Care | Payer: BC Managed Care – PPO | Source: Ambulatory Visit | Attending: Family Medicine | Admitting: Family Medicine

## 2021-04-06 DIAGNOSIS — Z1231 Encounter for screening mammogram for malignant neoplasm of breast: Secondary | ICD-10-CM

## 2021-08-18 ENCOUNTER — Other Ambulatory Visit: Payer: Self-pay

## 2021-08-18 ENCOUNTER — Emergency Department (INDEPENDENT_AMBULATORY_CARE_PROVIDER_SITE_OTHER)
Admission: EM | Admit: 2021-08-18 | Discharge: 2021-08-18 | Disposition: A | Discharge status: Home / Self Care | Payer: BC Managed Care – PPO | Source: Home / Self Care

## 2021-08-18 DIAGNOSIS — M546 Pain in thoracic spine: Secondary | ICD-10-CM

## 2021-08-18 DIAGNOSIS — S39012A Strain of muscle, fascia and tendon of lower back, initial encounter: Secondary | ICD-10-CM

## 2021-08-18 LAB — POCT URINALYSIS DIP (MANUAL ENTRY)
Bilirubin, UA: NEGATIVE
Blood, UA: NEGATIVE
Glucose, UA: NEGATIVE mg/dL
Ketones, POC UA: NEGATIVE mg/dL
Leukocytes, UA: NEGATIVE
Nitrite, UA: NEGATIVE
Protein Ur, POC: NEGATIVE mg/dL
Spec Grav, UA: >=1.03 — AB (ref 1.010–1.025)
Urobilinogen, UA: 0.2 E.U./dL
pH, UA: 5.5 (ref 5.0–8.0)

## 2021-08-18 MED ORDER — METHYLPREDNISOLONE ACETATE 40 MG/ML IJ SUSP
40.0000 mg | Freq: Once | INTRAMUSCULAR | Status: AC
  Administered 2021-08-18: 40 mg via INTRAMUSCULAR

## 2021-08-18 NOTE — ED Triage Notes (Addendum)
Pt here today c/o back pain/spasm that started yesterday while at [employer]. Pain woke her up at 3am. Ice last night. Hot shower prn. Takes flexeril and methocarbamol for hip issues already. Pain 8/10 Family hx of kidney stones, but none personally.

## 2021-08-18 NOTE — ED Provider Notes (Signed)
[facility]    CSN: [id number] Arrival date & time: 08/18/21  0913      History   Chief Complaint Chief Complaint  Patient presents with   Back Pain    spasm    HPI Kari Salinas is a 56 y.o. female.   Patient presents today with a 1 day history of right thoracic/lumbar back pain.  Reports symptoms began suddenly as she was walking into [employer].  It felt like a severe spasm and then gradually improved only to recur later on.  She has tried Flexeril, methocarbamol, prescribed Celebrex, tramadol with temporary improvement of symptoms.  She denies any bowel/bladder incontinence, lower extremity weakness, saddle anesthesia, paresthesias.  She does have a history of right hip arthritis but denies known degenerative disc disease or previous spinal surgery.  She denies any history of malignancy.  Reports pain is rated 8 on a 0-10 pain scale but worsens to 10 with certain movements, described as tightness/acid in her back, worse with certain movements such as going from a lying to a sitting position, no relieving factors identified.  She denies history of nephrolithiasis.  She denies any urinary symptoms.   Past Medical History:  Diagnosis Date   Complication of anesthesia    "fights waking up and gets nauseous"   Osteoarthritis of hip    right hip   PONV (postoperative nausea and vomiting)     Patient Active Problem List   Diagnosis Date Noted   Lower respiratory infection 09/03/2017    Past Surgical History:  Procedure Laterality Date   BREAST EXCISIONAL BIOPSY Left 07/17/2020   CHOLECYSTECTOMY  2008   HAND SURGERY Left 2005   PARTIAL HYSTERECTOMY     RADIOACTIVE SEED GUIDED EXCISIONAL BREAST BIOPSY Left 07/17/2020   Procedure: LEFT RADIOACTIVE SEED GUIDED EXCISIONAL BREAST BIOPSY;  Surgeon: [name], MD;  Location: [facility];  Service: General;  Laterality: Left;    OB History   No obstetric history on file.      Home  Medications    Prior to Admission medications   Medication Sig Start Date End Date Taking? Authorizing Provider  cyclobenzaprine (FLEXERIL) 10 MG tablet TAKE 1 TABLET BY MOUTH EVERY DAY AS NEEDED FOR MUSCLE SPASMS 11/05/15  Yes [provider]  ALPRAZolam (XANAX) 0.25 MG tablet Take 0.25 mg by mouth 2 (two) times daily. 07/20/21   [provider]  BIOTIN PO Take by mouth.    [provider]  celecoxib (CELEBREX) 200 MG capsule Take 200 mg by mouth daily. 08/17/21   [provider]  cetirizine (ZYRTEC) 10 MG tablet Take 10 mg by mouth daily.    [provider]  CRANBERRY PO Take by mouth.    [provider]  gabapentin (NEURONTIN) 300 MG capsule Take 300 mg by mouth at bedtime as needed. 07/20/21   [provider]  meloxicam (MOBIC) 15 MG tablet Take 15 mg by mouth daily.    [provider]  methocarbamol (ROBAXIN) 500 MG tablet Take by mouth 2 (two) times daily. 04/19/21   [provider]  Multiple Vitamins-Minerals (MULTIVITAMIN WITH MINERALS) tablet Take 1 tablet by mouth daily.    [provider]  Rhubarb (ESTROVEN COMPLETE PO) Take by mouth.    [provider]  traMADol (ULTRAM) 50 MG tablet Take 50 mg by mouth 2 (two) times daily.    [provider]  Turmeric (QC TUMERIC COMPLEX PO) Take by mouth.    [provider]  Family History History reviewed. No pertinent family history.  Social History Social History   Tobacco Use   Smoking status: Former    Types: Cigarettes    Quit date: 10/09/2011    Years since quitting: 9.8   Smokeless tobacco: Never  Vaping Use   Vaping Use: Never used  Substance Use Topics   Alcohol use: Yes    Comment: rarely   Drug use: Never     Allergies   Cephalexin, Levofloxacin, Other, and Wound dressing adhesive   Review of Systems Review of Systems  Constitutional:  Positive for activity change. Negative for appetite change,  fatigue and fever.  Respiratory:  Negative for cough and shortness of breath.   Cardiovascular:  Negative for chest pain.  Gastrointestinal:  Negative for abdominal pain, diarrhea, nausea and vomiting.  Musculoskeletal:  Positive for back pain. Negative for arthralgias and myalgias.  Neurological:  Negative for dizziness, weakness, light-headedness, numbness and headaches.    Physical Exam Triage Vital Signs ED Triage Vitals  Enc Vitals Group     BP 08/18/21 0933 139/83     Pulse Rate 08/18/21 0933 88     Resp 08/18/21 0933 18     Temp 08/18/21 0933 97.8 F (36.6 C)     Temp Source 08/18/21 0933 Oral     SpO2 08/18/21 0933 98 %     Weight --      Height --      Head Circumference --      Peak Flow --      Pain Score 08/18/21 0937 8     Pain Loc --      Pain Edu? --      Excl. in GC? --    No data found.  Updated Vital Signs BP 139/83 (BP Location: Left Arm)    Pulse 88    Temp 97.8 F (36.6 C) (Oral)    Resp 18    SpO2 98%   Visual Acuity Right Eye Distance:   Left Eye Distance:   Bilateral Distance:    Right Eye Near:   Left Eye Near:    Bilateral Near:     Physical Exam Vitals reviewed.  Constitutional:      General: She is awake. She is not in acute distress.    Appearance: Normal appearance. She is well-developed. She is not ill-appearing.     Comments: Very pleasant female appears stated age in no acute distress sitting comfortably in exam room  HENT:     Head: Normocephalic and atraumatic.  Cardiovascular:     Rate and Rhythm: Normal rate and regular rhythm.     Heart sounds: Normal heart sounds, S1 normal and S2 normal. No murmur heard. Pulmonary:     Effort: Pulmonary effort is normal.     Breath sounds: Normal breath sounds. No wheezing, rhonchi or rales.     Comments: Clear to auscultation bilaterally Abdominal:     General: Bowel sounds are normal.     Palpations: Abdomen is soft.     Tenderness: There is no abdominal tenderness. There is no  right CVA tenderness, left CVA tenderness, guarding or rebound.     Comments: Benign abdominal exam.  No CVA tenderness.  Musculoskeletal:     Cervical back: No tenderness or bony tenderness.     Thoracic back: Spasms and tenderness present. No bony tenderness.     Lumbar back: Tenderness present. No bony tenderness.     Comments: No pain percussion of vertebrae.  Tenderness  palpation of right thoracic and lumbar paraspinal muscles.  Spasm noted in lower thoracic region.  No deformity or step-off noted.  Strength 5/5 bilateral lower extremities.  Psychiatric:        Behavior: Behavior is cooperative.     UC Treatments / Results  Labs (all labs ordered are listed, but only abnormal results are displayed) Labs Reviewed  POCT URINALYSIS DIP (MANUAL ENTRY) - Abnormal; Notable for the following components:      Result Value   Spec Grav, UA >=1.030 (*)    All other components within normal limits    EKG   Radiology No results found.  Procedures Procedures (including critical care time)  Medications Ordered in UC Medications  methylPREDNISolone acetate (DEPO-MEDROL) injection 40 mg (has no administration in time range)    Initial Impression / Assessment and Plan / UC Course  I have reviewed the triage vital signs and the nursing notes.  Pertinent labs & imaging results that were available during my care of the patient were reviewed by me and considered in my medical decision making (see chart for details).     UA showed concentrated urine without evidence of infection or blood indicating nephrolithiasis.  Suspect musculoskeletal etiology given association with movement and physical exam findings.  Patient was encouraged to continue previously prescribed medication including NSAIDs, tramadol, muscle relaxers.  She was given an injection of Depo-Medrol in clinic for additional symptom relief.  Discussed side effects with this medication which patient expressed understanding.  She  was encouraged to use conservative treatment measures including heat, rest, stretch for symptom relief.  No indication for plain films as she has no bony tenderness and denies any recent trauma.  Discussed that if symptoms or not improving within a few days she should follow-up with her primary care to consider imaging and/or referral to physical therapy.  Discussed alarm symptoms that warrant emergent evaluation including severe pain, weakness, bowel/bladder incontinence, lower extremity weakness.  Strict return precautions given to which she expressed understanding.  Final Clinical Impressions(s) / UC Diagnoses   Final diagnoses:  Acute right-sided thoracic back pain  Strain of lumbar region, initial encounter     Discharge Instructions      We gave you an injection of steroids today.  Continue your previously prescribed medication for pain.  Use heat, rest, stretch for additional symptom relief.  Make sure you are drinking plenty of fluid.  If you have any worsening symptoms including increased pain, difficulty walking, lower extremity weakness, numbness on the inside of your thighs, difficulty going to the bathroom you need to go to the emergency room.  If symptoms or not improving within a few days please follow-up with your primary care to consider referral to physical therapy or more imaging.     ED Prescriptions   None    PDMP not reviewed this encounter.   Jeani Hawking, PA-C 08/18/21 1008

## 2021-08-18 NOTE — Discharge Instructions (Signed)
We gave you an injection of steroids today.  Continue your previously prescribed medication for pain.  Use heat, rest, stretch for additional symptom relief.  Make sure you are drinking plenty of fluid.  If you have any worsening symptoms including increased pain, difficulty walking, lower extremity weakness, numbness on the inside of your thighs, difficulty going to the bathroom you need to go to the emergency room.  If symptoms or not improving within a few days please follow-up with your primary care to consider referral to physical therapy or more imaging.

## 2021-10-20 ENCOUNTER — Encounter (HOSPITAL_COMMUNITY): Payer: Self-pay

## 2023-06-02 ENCOUNTER — Other Ambulatory Visit (HOSPITAL_COMMUNITY): Payer: Self-pay

## 2023-06-02 MED ORDER — TIRZEPATIDE-WEIGHT MANAGEMENT 15 MG/0.5ML ~~LOC~~ SOAJ
15.0000 mg | SUBCUTANEOUS | 4 refills | Status: AC
  Filled 2023-06-02: qty 2, 28d supply, fill #0
  Filled 2023-06-30 – 2023-07-04 (×2): qty 2, 28d supply, fill #1
  Filled 2023-07-22 – 2023-07-26 (×3): qty 2, 28d supply, fill #2

## 2023-06-28 IMAGING — MG MM DIGITAL SCREENING BILAT W/ TOMO AND CAD
8 series · 8 of 24 positions shown · non-contrast
Comparison: Previous exam(s).

CLINICAL DATA: Screening.

EXAM:
DIGITAL SCREENING BILATERAL MAMMOGRAM WITH TOMOSYNTHESIS AND CAD
TECHNIQUE: Bilateral screening digital craniocaudal and mediolateral oblique
mammograms were obtained. Bilateral screening digital breast
tomosynthesis was performed. The images were evaluated with
computer-aided detection.

[R CC synth-2D]
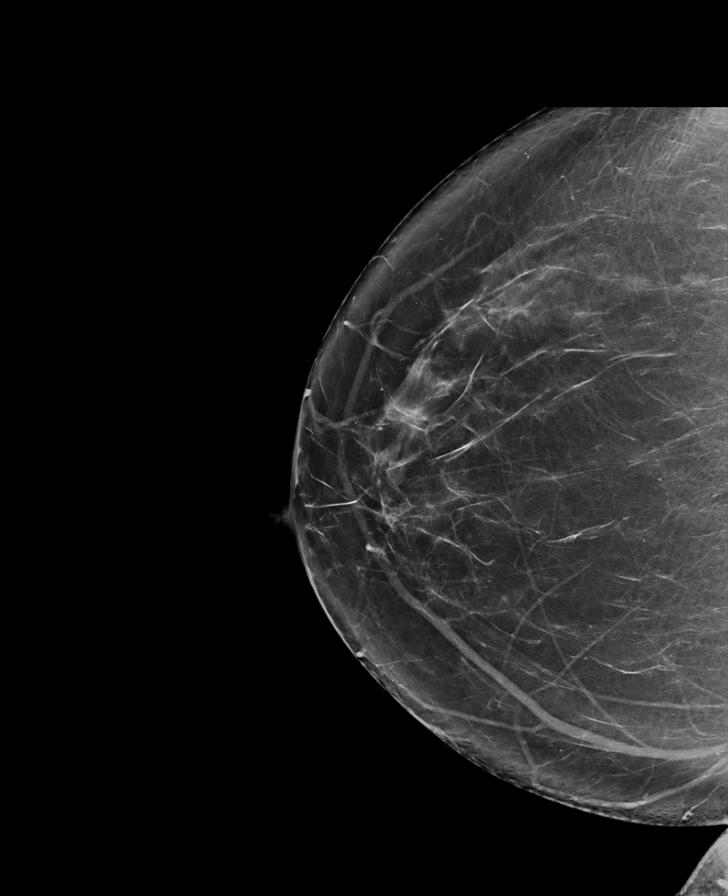

[R MLO synth-2D]
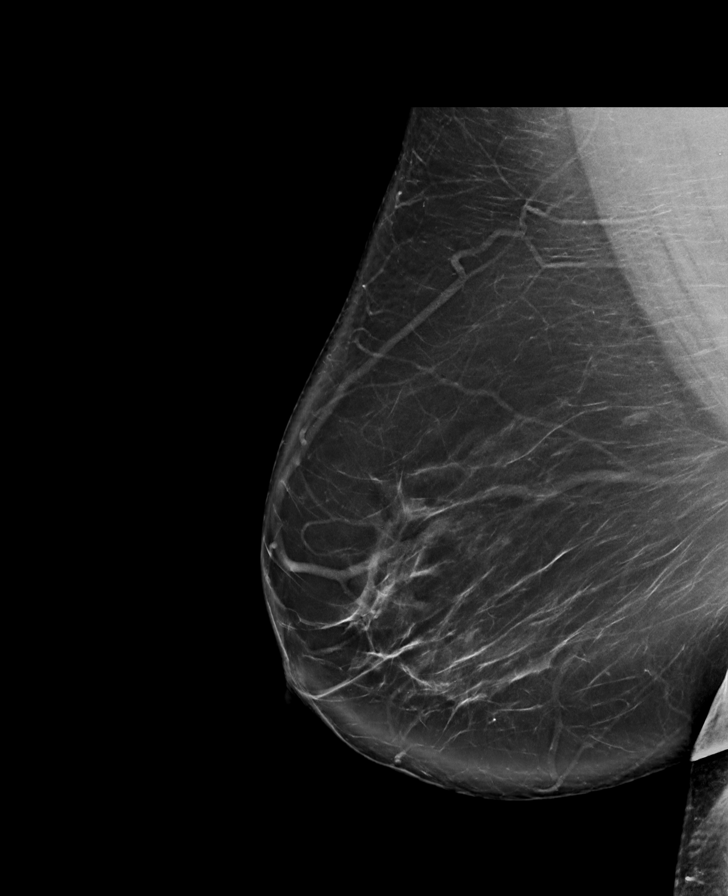

[L CC synth-2D]
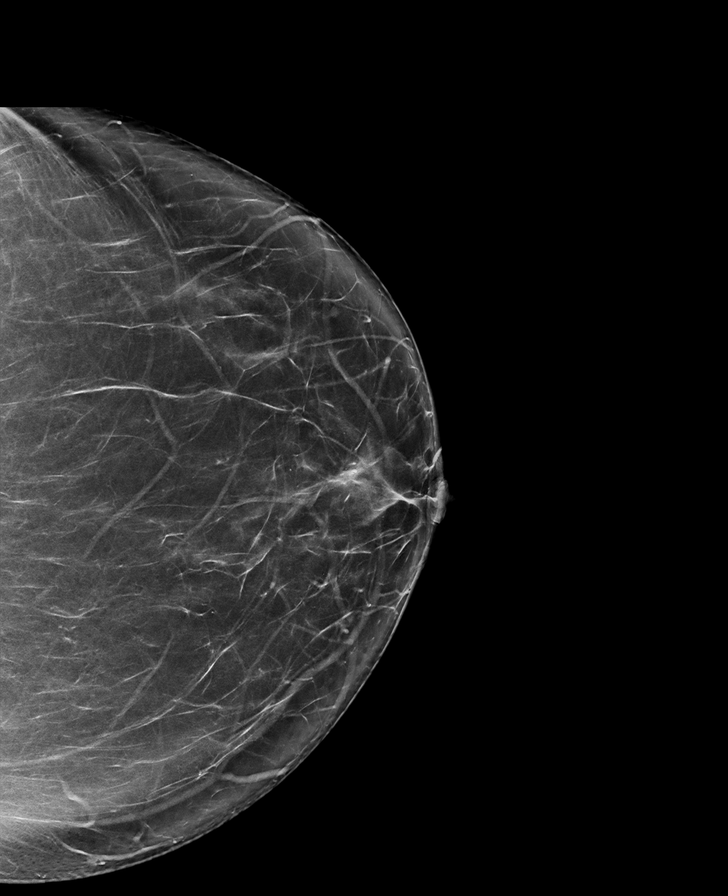

[L MLO synth-2D]
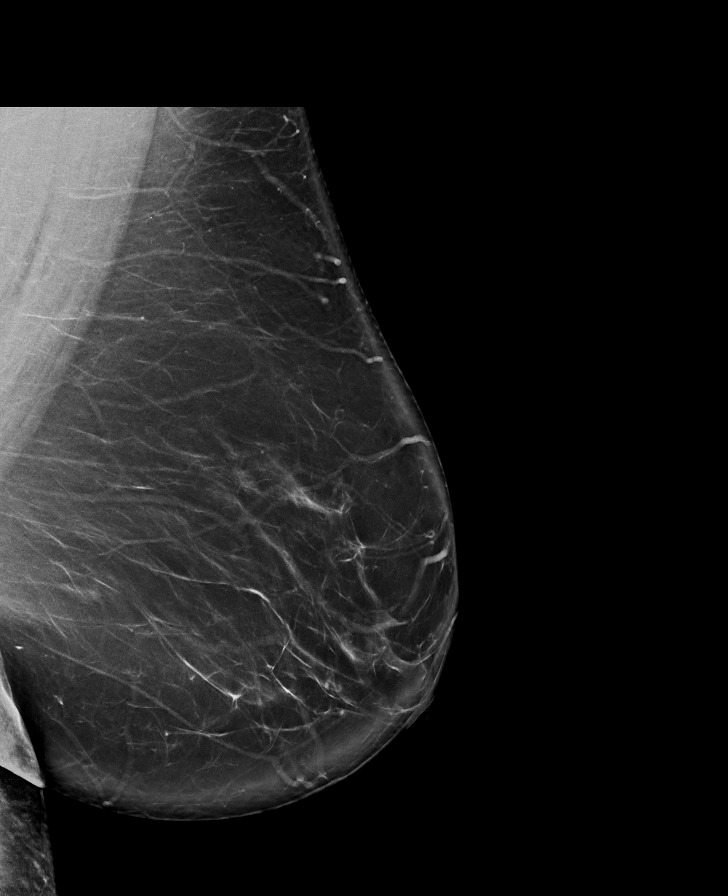

[R MLO tomo · tomo slice 51/101.0]
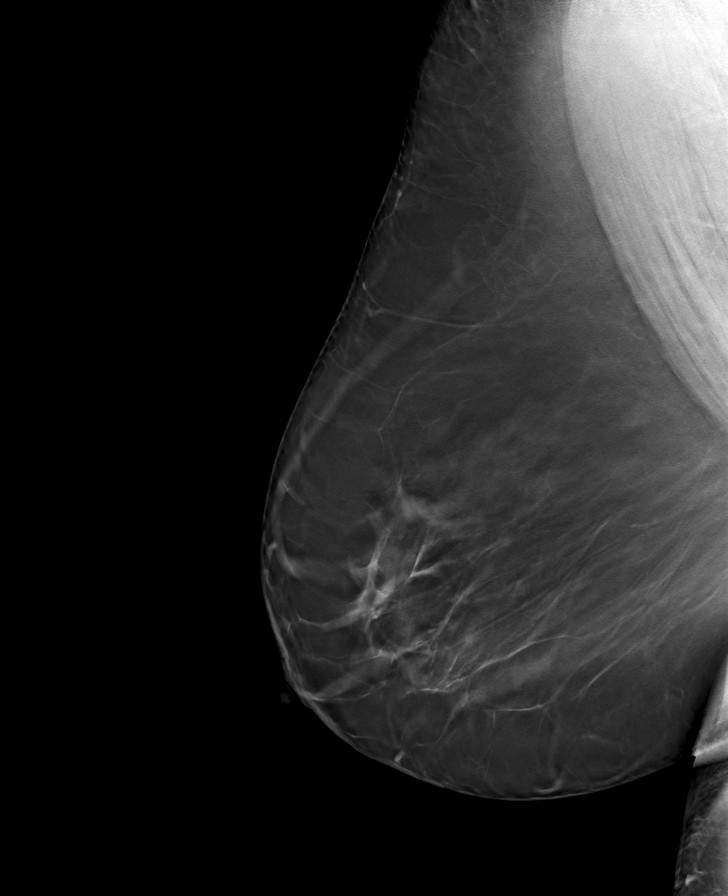

[L MLO tomo · tomo slice 52/103.0]
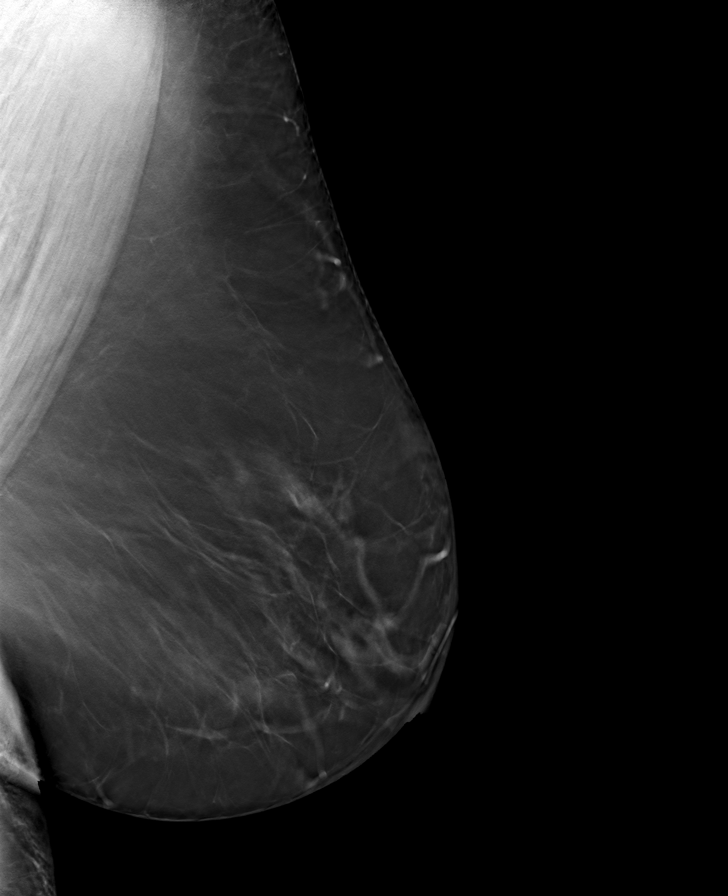

[R CC tomo · tomo slice 41/82.0]
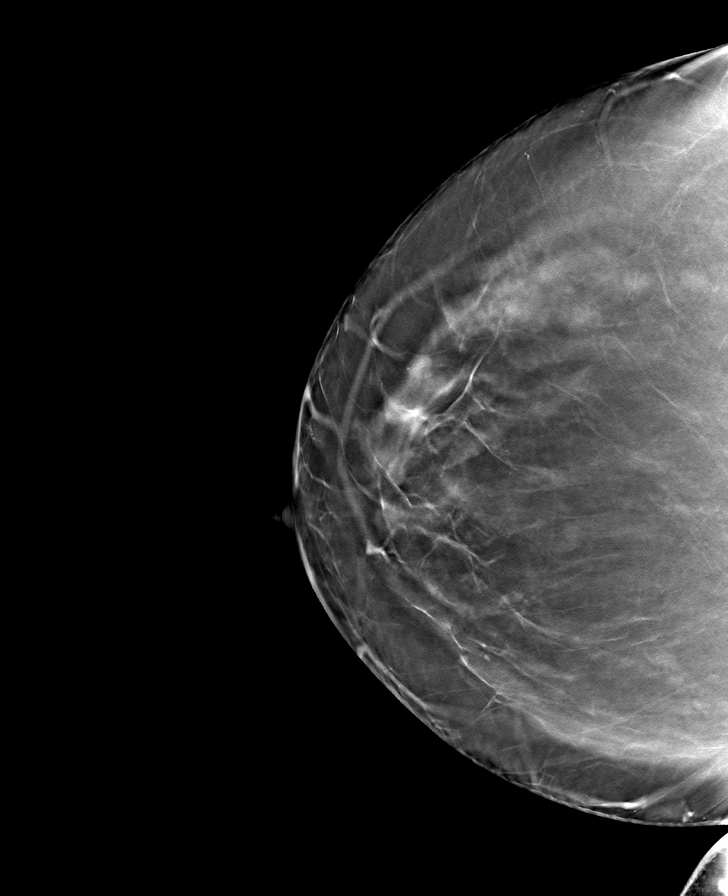

[L CC tomo · tomo slice 41/80.0]
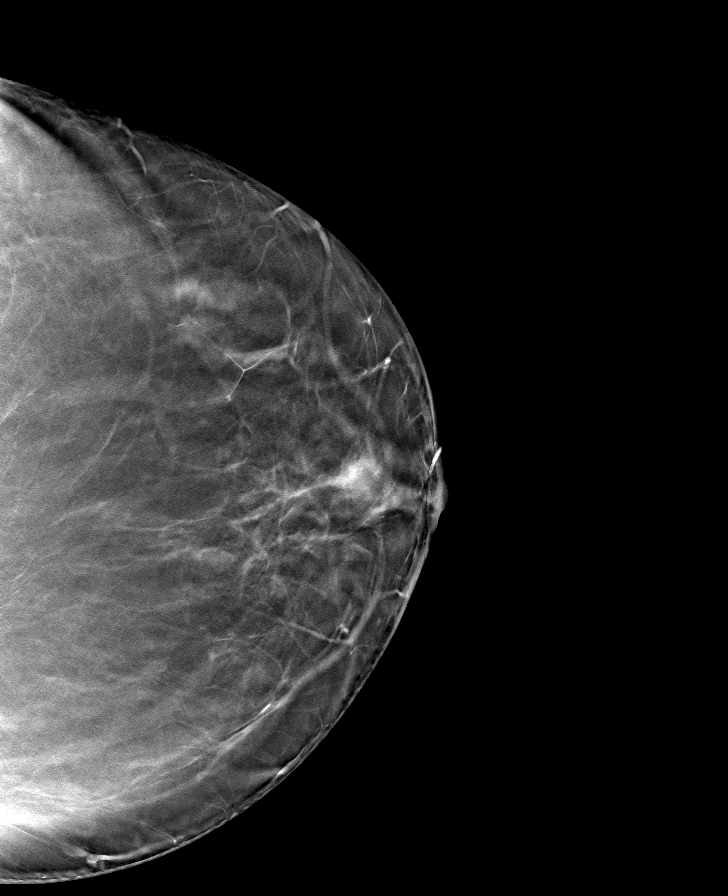

[8 of 24 positions shown; findings below may reference images not displayed]

ACR Breast Density Category b: There are scattered areas of
fibroglandular density.
FINDINGS: There are no findings suspicious for malignancy.
IMPRESSION: No mammographic evidence of malignancy. A result letter of this
screening mammogram will be mailed directly to the patient.

RECOMMENDATION:
Screening mammogram in one year. (Code:51-O-LD2)

BI-RADS CATEGORY  1: Negative.

## 2023-07-01 ENCOUNTER — Other Ambulatory Visit (HOSPITAL_COMMUNITY): Payer: Self-pay

## 2023-07-01 ENCOUNTER — Encounter (HOSPITAL_COMMUNITY): Payer: Self-pay

## 2023-07-04 ENCOUNTER — Other Ambulatory Visit (HOSPITAL_COMMUNITY): Payer: Self-pay

## 2023-07-04 MED ORDER — ZEPBOUND 15 MG/0.5ML ~~LOC~~ SOAJ
15.0000 mg | SUBCUTANEOUS | 4 refills | Status: AC
  Filled 2023-07-04: qty 2, 28d supply, fill #0
  Filled 2023-07-04: qty 6, 84d supply, fill #0

## 2023-07-05 ENCOUNTER — Other Ambulatory Visit (HOSPITAL_COMMUNITY): Payer: Self-pay

## 2023-07-22 ENCOUNTER — Other Ambulatory Visit (HOSPITAL_COMMUNITY): Payer: Self-pay

## 2023-07-24 ENCOUNTER — Other Ambulatory Visit (HOSPITAL_COMMUNITY): Payer: Self-pay

## 2023-07-26 ENCOUNTER — Other Ambulatory Visit (HOSPITAL_COMMUNITY): Payer: Self-pay

## 2023-07-29 ENCOUNTER — Other Ambulatory Visit (HOSPITAL_COMMUNITY): Payer: Self-pay
